# Patient Record
Sex: Male | Born: 1960 | Race: White | Hispanic: No | Marital: Single | Smoking: Former smoker
Health system: Southern US, Community
[De-identification: ages and names within clinical notes are randomized; demographics above are authoritative.]

## PROBLEM LIST (undated history)

## (undated) DIAGNOSIS — I10 Essential (primary) hypertension: Secondary | ICD-10-CM

## (undated) DIAGNOSIS — C801 Malignant (primary) neoplasm, unspecified: Secondary | ICD-10-CM

## (undated) DIAGNOSIS — M1990 Unspecified osteoarthritis, unspecified site: Secondary | ICD-10-CM

---

## 1998-07-09 ENCOUNTER — Inpatient Hospital Stay (HOSPITAL_COMMUNITY): Admission: EM | Admit: 1998-07-09 | Discharge: 1998-07-11 | Payer: Self-pay | Admitting: Emergency Medicine

## 2009-04-17 ENCOUNTER — Emergency Department (HOSPITAL_COMMUNITY): Admission: EM | Admit: 2009-04-17 | Discharge: 2009-04-17 | Payer: Self-pay | Admitting: Emergency Medicine

## 2010-12-16 IMAGING — CR DG ANKLE COMPLETE 3+V*R*
3 series · 3 of 3 positions shown · non-contrast
Comparison: None

CLINICAL DATA: Lateral and medial ankle pain secondary to a fall
today.

RIGHT ANKLE - COMPLETE 3+ VIEW

[t ankle joint ap right *]
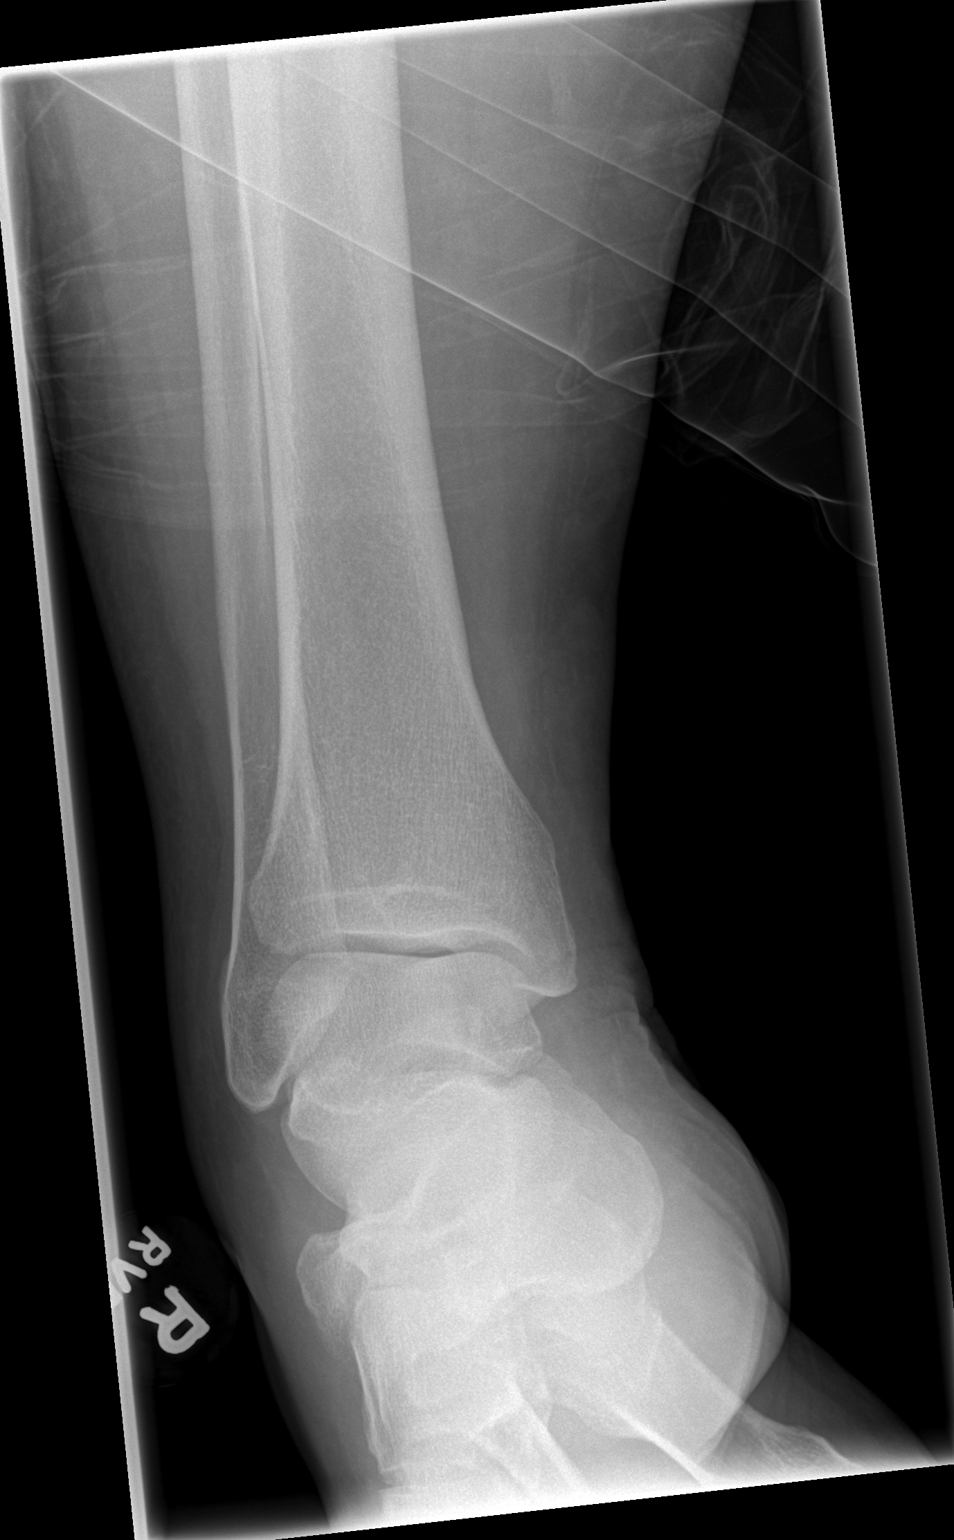

[t ankle joint oblique right]
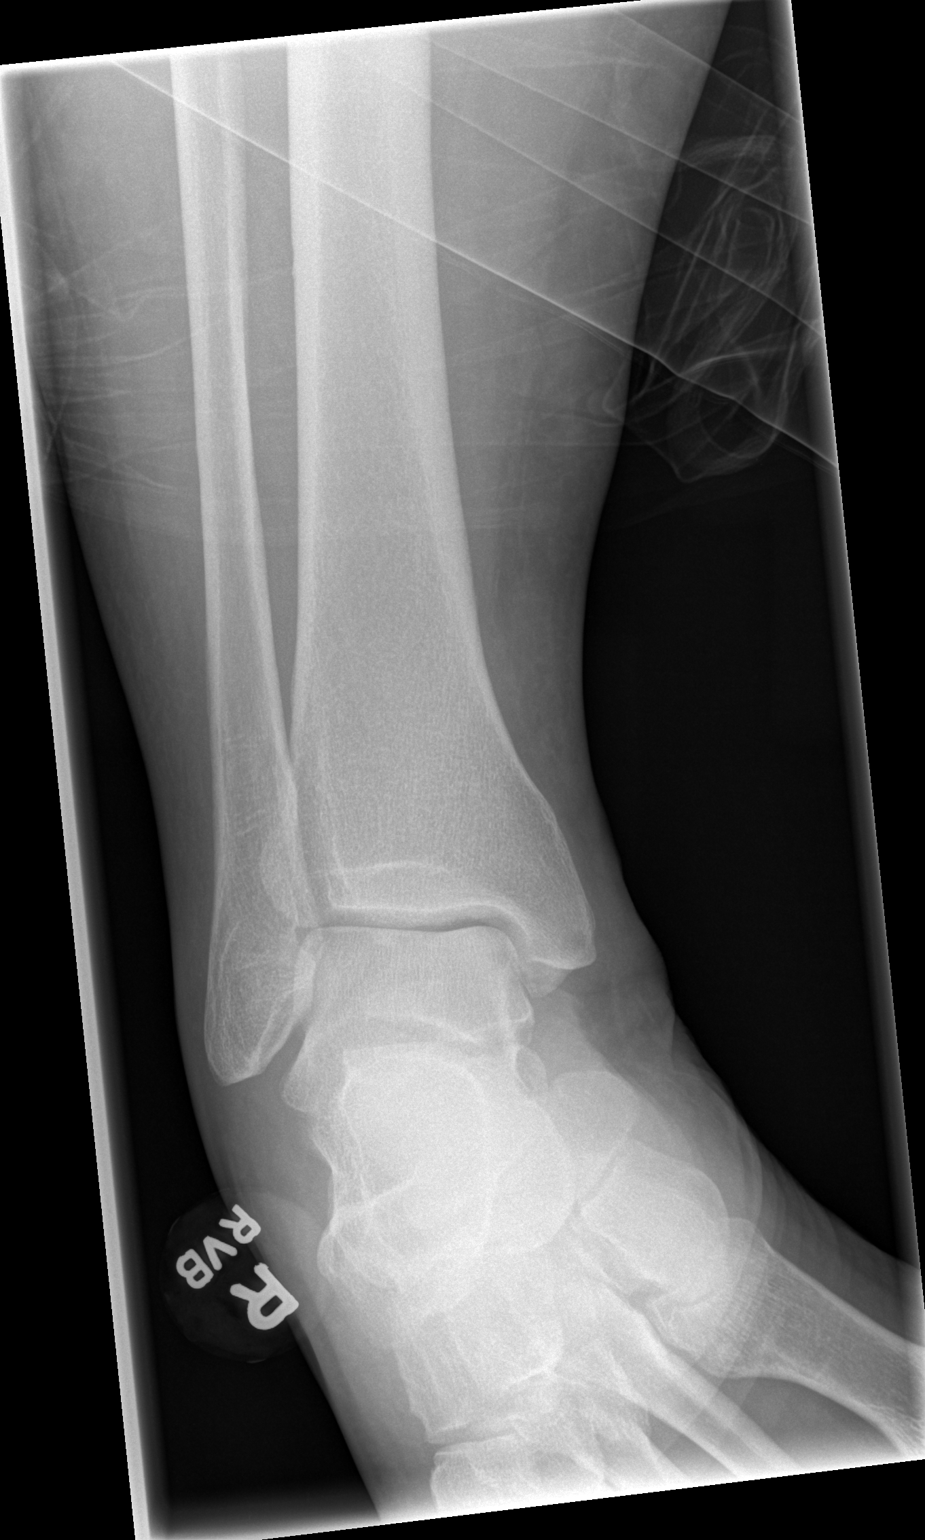

[t ankle joint lat right *]
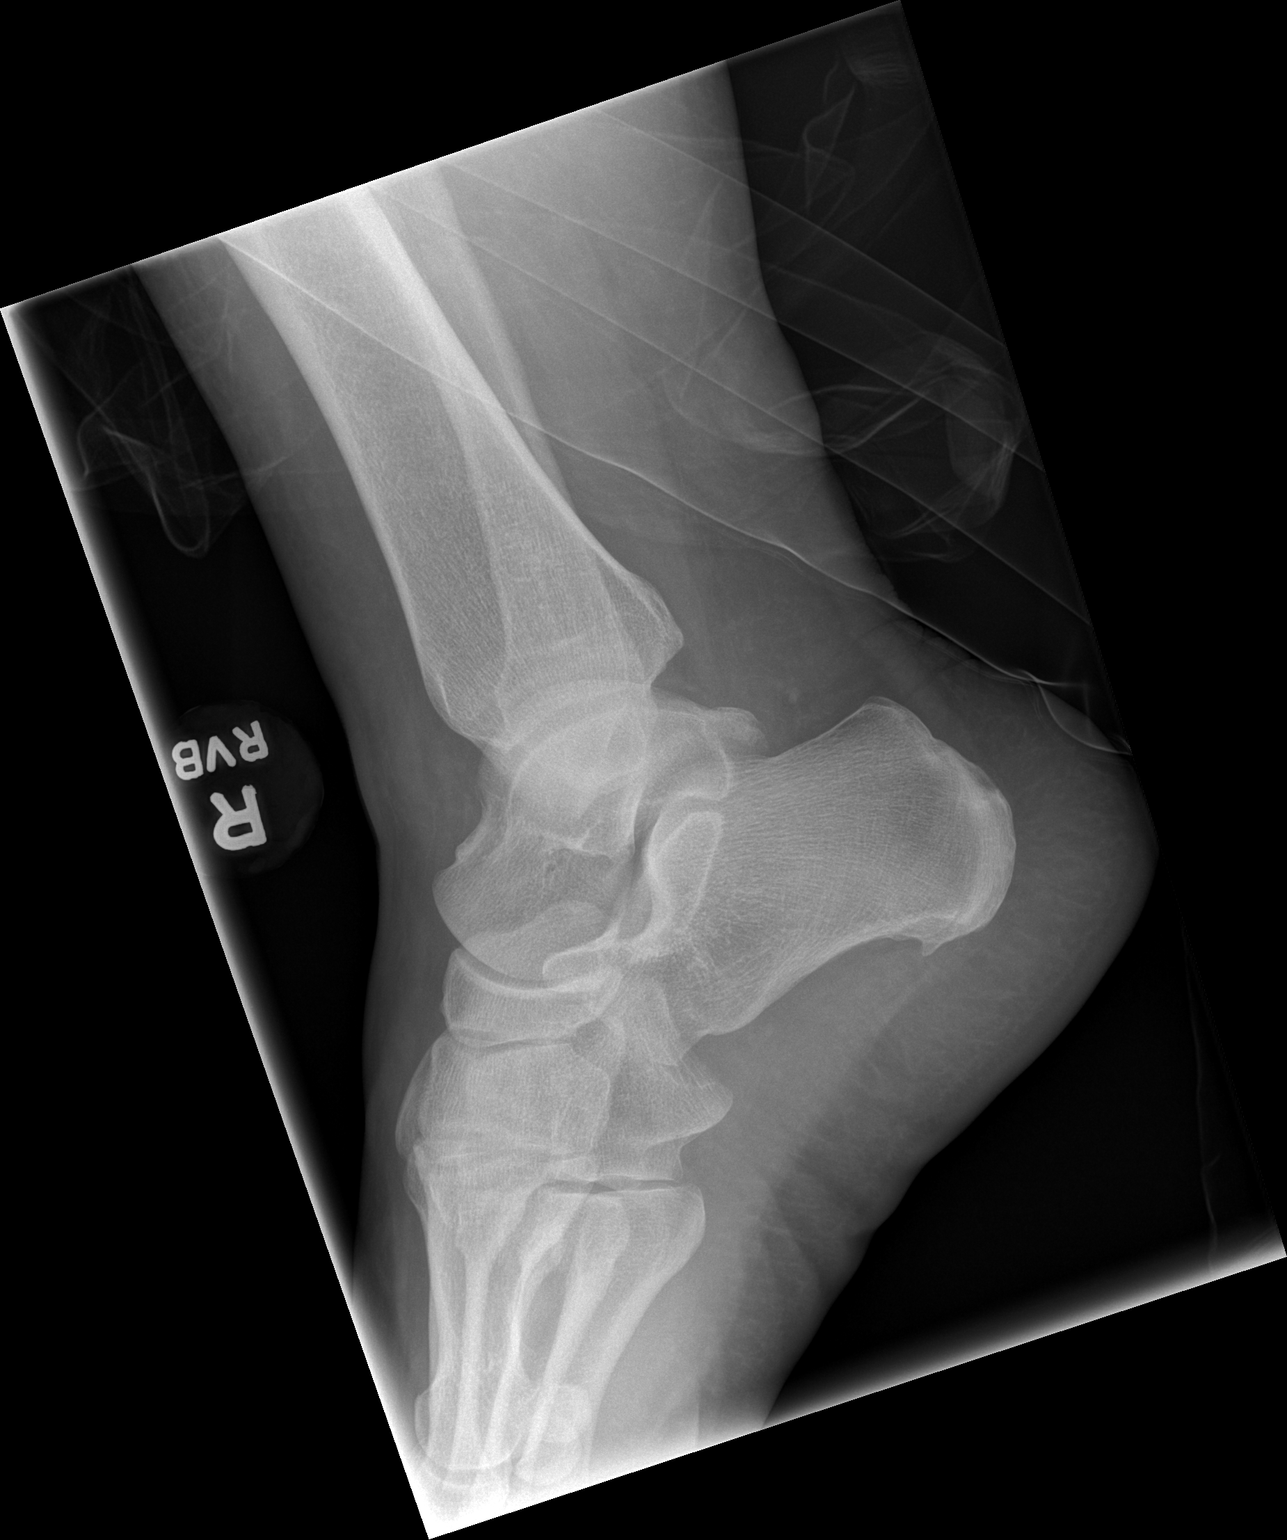

[3 of 3 positions shown; findings below may reference images not displayed]

FINDINGS: There is no fracture or dislocation.  No appreciable
joint effusion.
IMPRESSION: No significant abnormality of the right ankle.

## 2010-12-16 IMAGING — CT CT EXTREM LOW W/O CM*R*
3 of 6 series · 6 of 33 positions shown, 7 images · non-contrast
Comparison: All radiographs dated 04/17/2009

CLINICAL DATA: Posterior ankle pain after trauma

CT OF THE LEFT ANKLE WITHOUT CONTRAST
TECHNIQUE: Multidetector CT imaging of the old left ankle was
performed according to the standard protocol without intravenous
contrast. Multiplanar CT image reconstructions were also generated.

[Series 2: control scan 2.0 b60s · axial · 0.49mm/px · z∈[-1170,-1170]mm · 1 of 1 slices shown, 2 images]
[im 1/1  soft-tissue]
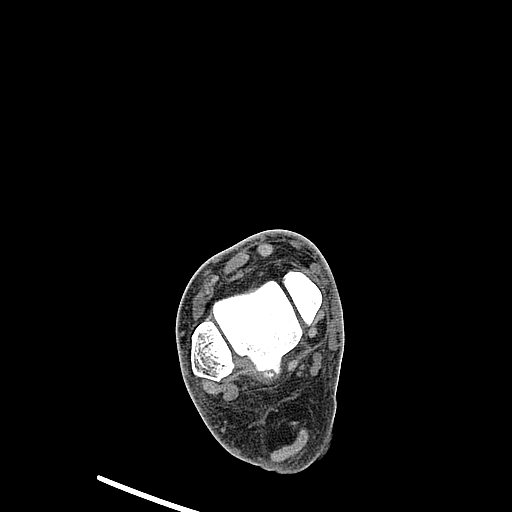
[im 1/1  bone]
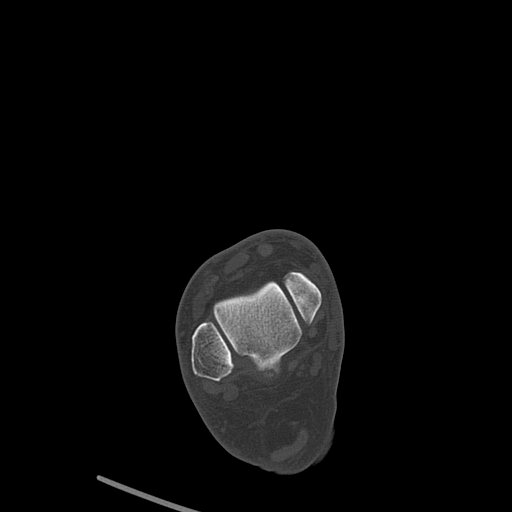

[Series 602: coronal bone · coronal · 0.38mm/px · 1 of 137 slices shown]
[im 69/137  bone]
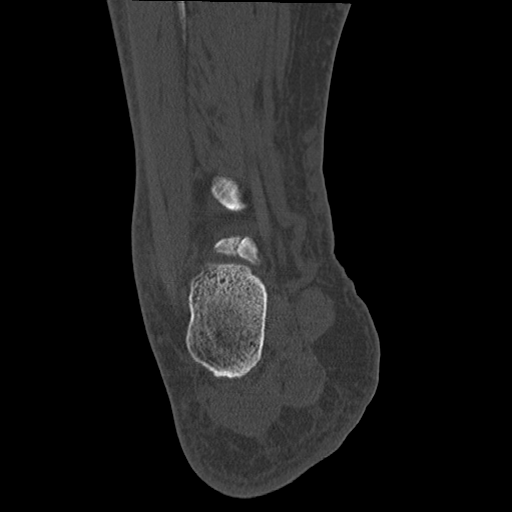

[Series 603: sagittal bone · sagittal · 0.38mm/px · 4 of 100 slices shown]
[im 20/100  bone]
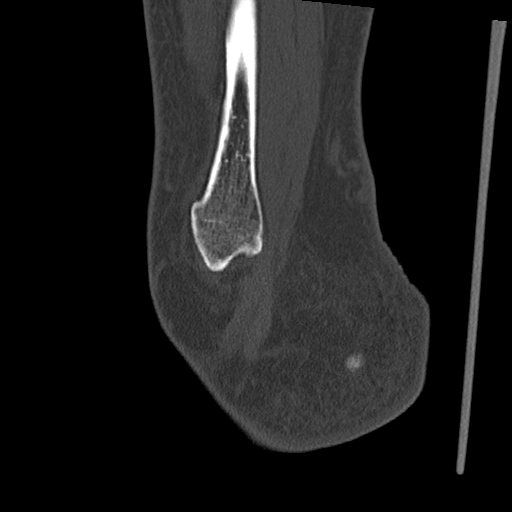
[im 40/100  bone]
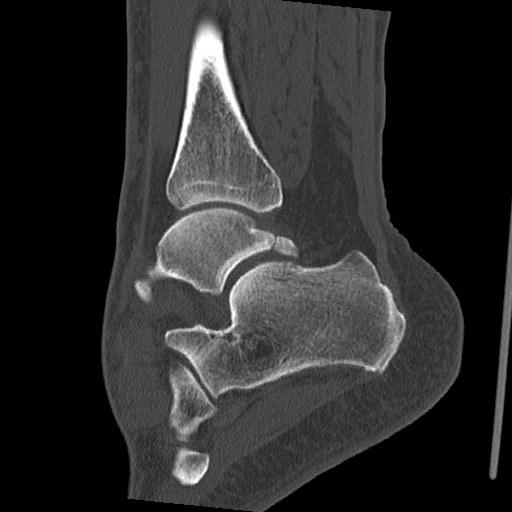
[im 60/100  bone]
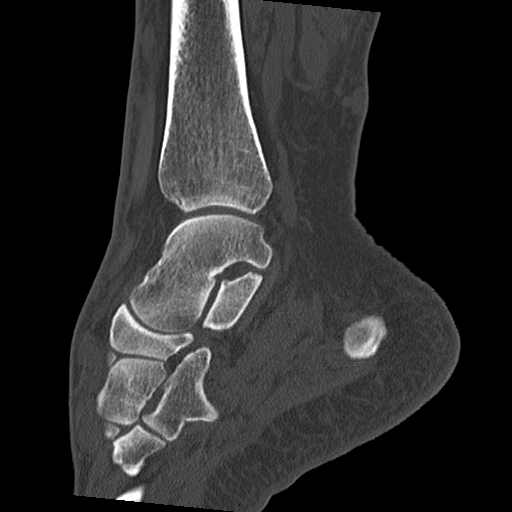
[im 80/100  bone]
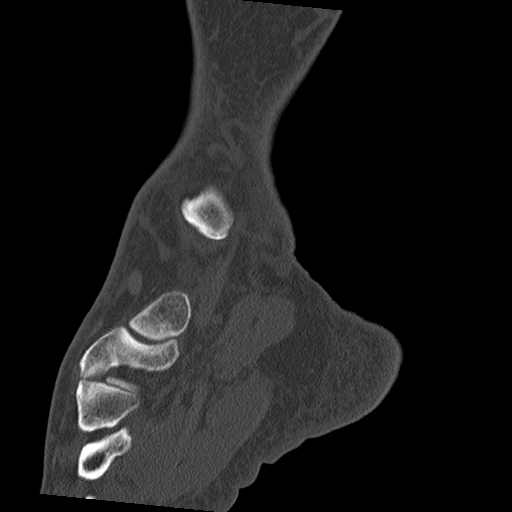

[6 of 33 positions shown; findings below may reference images not displayed]

FINDINGS: There is no acute bony abnormality of the ankle.  Note
is made of an anatomic variant of an os trigonum posterior to the
talus.  There is a small spur on the anterior inferior margin of
the distal tibia.  This also some slight spurring of the dorsal
distal talus.  There is a tiny accessory calcification or old
avulsion at the anterior process of the calcaneus.

The Achilles tendon is well visualized in three planes and is
intact.

There is no significant ankle joint effusion or subtalar joint
effusion.

The patient has prominent varicosities around the ankle.
IMPRESSION: No acute bony abnormality.  The Achilles tendon is normal.

## 2021-06-18 ENCOUNTER — Other Ambulatory Visit: Payer: Self-pay

## 2021-06-18 ENCOUNTER — Ambulatory Visit
Admission: EM | Admit: 2021-06-18 | Discharge: 2021-06-18 | Disposition: A | Discharge status: Home / Self Care | Payer: Self-pay | Attending: Emergency Medicine | Admitting: Emergency Medicine

## 2021-06-18 DIAGNOSIS — I951 Orthostatic hypotension: Secondary | ICD-10-CM

## 2021-06-18 DIAGNOSIS — R42 Dizziness and giddiness: Secondary | ICD-10-CM

## 2021-06-18 HISTORY — DX: Essential (primary) hypertension: I10

## 2021-06-18 HISTORY — DX: Malignant (primary) neoplasm, unspecified: C80.1

## 2021-06-18 HISTORY — DX: Unspecified osteoarthritis, unspecified site: M19.90

## 2021-06-18 LAB — POCT FASTING CBG KUC MANUAL ENTRY: POCT Glucose (KUC): 103 mg/dL — AB (ref 70–99)

## 2021-06-18 NOTE — ED Provider Notes (Signed)
HPI  SUBJECTIVE:  Timothy Valencia is a 60 y.o. male who presents with nausea, dizziness described as lightheadedness, tinnitus, hot and cold flashes and a sensation of "about to pass out" when he stands up.  It resolves when he lies down.  States it has happened 4 times today.  States that he has been eating and drinking well, and that he drinks over 64 ounces of water a day.  No accompanying palpitations, chest pain, shortness of breath, vertigo.  No recent vomiting, diarrhea, change in medications, increased exercise.  He states that he eats 5-6 small meals per day.  He states that he has been having similar episodes like this over the past 3 weeks but has not yet sought medical attention for this.  States that this happened once before, and he needed to have his blood pressure medication cut in half.  He is currently on 100 mg of losartan once daily.  He recently lost 65 pounds intentionally.  He tried drinking orange juice with some improvement in his symptoms.  Symptoms are better with lying down, worse with standing up.  He has a past medical history of cancer, hypertension, hypercholesterolemia, chronic pain and is on chronic opiates for the past 15 years.  No history of vertigo, diabetes, hypoglycemia, MI, arrhythmia.     Past Medical History:  Diagnosis Date   Arthritis    Cancer ([place])    Hypertension     Past Surgical History:  Procedure Laterality Date   APPENDECTOMY     GASTRIC BYPASS     HERNIA REPAIR      History reviewed. No pertinent family history.  Social History   Tobacco Use   Smoking status: Former    Types: Cigarettes    Quit date: 2017    Years since quitting: 5.5   Smokeless tobacco: Never    No current facility-administered medications for this encounter.  Current Outpatient Medications:    albuterol (VENTOLIN HFA) 108 (90 Base) MCG/ACT inhaler, INHALE 1 PUFF BY MOUTH EVERY 4 TO 6 HOURS AS NEEDED FOR SHORTNESS OF BREATH, Disp: , Rfl:    ALPRAZolam  (XANAX) 1 MG tablet, TAKE 1 TABLET(1 MG) BY MOUTH AT BEDTIME AS NEEDED FOR SLEEP, Disp: , Rfl:    baclofen (LIORESAL) 10 MG tablet, Take 1 tablet by mouth 3 (three) times daily., Disp: , Rfl:    HYDROcodone-acetaminophen (NORCO) 10-325 MG tablet, Take by mouth., Disp: , Rfl:    losartan (COZAAR) 50 MG tablet, TAKE 1 TABLET(50 MG) BY MOUTH DAILY FOR BLOOD PRESSURE, Disp: , Rfl:    meclizine (ANTIVERT) 25 MG tablet, Take by mouth., Disp: , Rfl:    omeprazole (PRILOSEC) 20 MG capsule, TAKE 1 CAPSULE(20 MG) BY MOUTH DAILY, Disp: , Rfl:    aspirin 81 MG EC tablet, Take by mouth., Disp: , Rfl:    simvastatin (ZOCOR) 20 MG tablet, Take 20 mg by mouth at bedtime., Disp: , Rfl:   No Known Allergies   ROS  As noted in HPI.   Physical Exam  BP 123/79 (BP Location: Left Arm)   Pulse 64   Temp 98.2 F (36.8 C) (Oral)   Resp 18   SpO2 96%   Constitutional: Well developed, well nourished, no acute distress Eyes:  EOMI, conjunctiva normal bilaterally HENT: Normocephalic, atraumatic,mucus membranes moist Respiratory: Normal inspiratory effort, lungs clear bilaterally, good air movement Cardiovascular: Normal rate regular rhythm, no murmurs rubs or gallops GI: nondistended skin: No rash, skin intact Musculoskeletal: no deformities Neurologic: Alert &  oriented x 3, no focal neuro deficits.  Cranial nerves III through XII intact.,  Coordination normal. Psychiatric: Speech and behavior appropriate   ED Course   Medications - No data to display  Orders Placed This Encounter  Procedures   Orthostatic vital signs    Standing Status:   Standing    Number of Occurrences:   1   POCT CBG (manual entry)    Standing Status:   Standing    Number of Occurrences:   1    Results for orders placed or performed during the hospital encounter of 06/18/21 (from the past 24 hour(s))  POCT CBG (manual entry)     Status: Abnormal   Collection Time: 06/18/21  5:28 PM  Result Value Ref Range   POCT  Glucose ([facility]) 103 (A) 70 - 99 mg/dL   No results found. Results for orders placed or performed during the hospital encounter of 57/50/51  Basic metabolic panel  Result Value Ref Range   Glucose 95 65 - 99 mg/dL   BUN 16 6 - 24 mg/dL   Creatinine, Ser 0.86 0.76 - 1.27 mg/dL   eGFR 100 >59 mL/min/1.73   BUN/Creatinine Ratio 19 9 - 20   Sodium 144 134 - 144 mmol/L   Potassium 4.5 3.5 - 5.2 mmol/L   Chloride 108 (H) 96 - 106 mmol/L   CO2 23 20 - 29 mmol/L   Calcium 9.4 8.7 - 10.2 mg/dL  POCT CBG (manual entry)  Result Value Ref Range   POCT Glucose ([facility]) 103 (A) 70 - 99 mg/dL     ED Clinical Impression  1. Positional lightheadedness   2. Orthostasis      ED Assessment/Plan  I suspect patient's blood pressure medicine dosing is too high with a 65 pound weight loss recently.  His fingerstick is normal.  In the the differential is an electrolyte imbalance such as hyponatremia, given that he drinks "lots of water" so will check BMP.  Patient is orthostatic.  His heart rate goes from 56 to 76.  Advised electrolyte containing fluids, salty foods for the next several days.  If this does not help, he is to reduce his losartan to 50 mg daily, but he will notify his PMD immediately if he does so.  BMP normal.  Discussed labs, MDM, treatment plan, and plan for follow-up with patient. Discussed sn/sx that should prompt return to the ED. patient agrees with plan.   No orders of the defined types were placed in this encounter.     *This clinic note was created using Dragon dictation software. Therefore, there may be occasional mistakes despite careful proofreading.  ?    Melynda Ripple, MD 06/20/21 [phone]

## 2021-06-18 NOTE — Discharge Instructions (Addendum)
Push electrolyte containing fluids such as Pedialyte or Gatorade.  Try adding a little more salt into your diet over the next day or 2, but do not do this for more than a few days.  I would cut your losartan from 100 mg to 50 mg if salt does not make you feel better.  Let your doctor know that you are doing this if you do so.  I will contact you only if your labs come back abnormal, and I need to send you to the hospital for further evaluation.

## 2021-06-18 NOTE — ED Triage Notes (Signed)
Onset this morning of nausea, light headedness, dizziness and fatigue. Pt reports sxs alleviated when he laid down and returned once he stood. No LOC or falls. Pt took his BP meds, thinking that may be the cause. No other meds taken. No emesis.   Pt previously prescribed antivert, but did not have any to take today.

## 2021-06-20 LAB — BASIC METABOLIC PANEL
BUN/Creatinine Ratio: 19 (ref 9–20)
BUN: 16 mg/dL (ref 6–24)
CO2: 23 mmol/L (ref 20–29)
Calcium: 9.4 mg/dL (ref 8.7–10.2)
Chloride: 108 mmol/L — ABNORMAL HIGH (ref 96–106)
Creatinine, Ser: 0.86 mg/dL (ref 0.76–1.27)
Glucose: 95 mg/dL (ref 65–99)
Potassium: 4.5 mmol/L (ref 3.5–5.2)
Sodium: 144 mmol/L (ref 134–144)
eGFR: 100 mL/min/{1.73_m2} (ref >59)

## 2021-06-25 ENCOUNTER — Emergency Department: Payer: Medicare Other

## 2021-06-25 ENCOUNTER — Encounter: Payer: Self-pay | Admitting: Emergency Medicine

## 2021-06-25 ENCOUNTER — Emergency Department
Admission: EM | Admit: 2021-06-25 | Discharge: 2021-06-25 | Disposition: A | Discharge status: Home / Self Care | Payer: Medicare Other | Attending: Emergency Medicine | Admitting: Emergency Medicine

## 2021-06-25 ENCOUNTER — Other Ambulatory Visit: Payer: Self-pay

## 2021-06-25 DIAGNOSIS — Z5321 Procedure and treatment not carried out due to patient leaving prior to being seen by health care provider: Secondary | ICD-10-CM | POA: Diagnosis not present

## 2021-06-25 DIAGNOSIS — R251 Tremor, unspecified: Secondary | ICD-10-CM | POA: Insufficient documentation

## 2021-06-25 DIAGNOSIS — R0602 Shortness of breath: Secondary | ICD-10-CM | POA: Insufficient documentation

## 2021-06-25 DIAGNOSIS — R0789 Other chest pain: Secondary | ICD-10-CM | POA: Insufficient documentation

## 2021-06-25 DIAGNOSIS — R110 Nausea: Secondary | ICD-10-CM | POA: Diagnosis not present

## 2021-06-25 DIAGNOSIS — R42 Dizziness and giddiness: Secondary | ICD-10-CM | POA: Diagnosis not present

## 2021-06-25 LAB — CBC WITH DIFFERENTIAL/PLATELET
Abs Immature Granulocytes: 0.05 10*3/uL (ref 0.00–0.07)
Basophils Absolute: 0.1 10*3/uL (ref 0.0–0.1)
Basophils Relative: 1 %
Eosinophils Absolute: 0.2 10*3/uL (ref 0.0–0.5)
Eosinophils Relative: 2 %
HCT: 36.6 % — ABNORMAL LOW (ref 39.0–52.0)
Hemoglobin: 11.9 g/dL — ABNORMAL LOW (ref 13.0–17.0)
Immature Granulocytes: 1 %
Lymphocytes Relative: 32 %
Lymphs Abs: 2.3 10*3/uL (ref 0.7–4.0)
MCH: 26.8 pg (ref 26.0–34.0)
MCHC: 32.5 g/dL (ref 30.0–36.0)
MCV: 82.4 fL (ref 80.0–100.0)
Monocytes Absolute: 0.4 10*3/uL (ref 0.1–1.0)
Monocytes Relative: 6 %
Neutro Abs: 4.1 10*3/uL (ref 1.7–7.7)
Neutrophils Relative %: 58 %
Platelets: 280 10*3/uL (ref 150–400)
RBC: 4.44 MIL/uL (ref 4.22–5.81)
RDW: 14.8 % (ref 11.5–15.5)
WBC: 7.1 10*3/uL (ref 4.0–10.5)
nRBC: 0 % (ref 0.0–0.2)

## 2021-06-25 LAB — COMPREHENSIVE METABOLIC PANEL
ALT: 19 U/L (ref 0–44)
AST: 16 U/L (ref 15–41)
Albumin: 3.9 g/dL (ref 3.5–5.0)
Alkaline Phosphatase: 55 U/L (ref 38–126)
Anion gap: 6 (ref 5–15)
BUN: 18 mg/dL (ref 6–20)
CO2: 27 mmol/L (ref 22–32)
Calcium: 9.5 mg/dL (ref 8.9–10.3)
Chloride: 105 mmol/L (ref 98–111)
Creatinine, Ser: 0.95 mg/dL (ref 0.61–1.24)
GFR, Estimated: >60 mL/min (ref >60)
Glucose, Bld: 103 mg/dL — ABNORMAL HIGH (ref 70–99)
Potassium: 4.1 mmol/L (ref 3.5–5.1)
Sodium: 138 mmol/L (ref 135–145)
Total Bilirubin: 0.7 mg/dL (ref 0.3–1.2)
Total Protein: 6.8 g/dL (ref 6.5–8.1)

## 2021-06-25 LAB — LIPASE, BLOOD: Lipase: 26 U/L (ref 11–51)

## 2021-06-25 LAB — TROPONIN I (HIGH SENSITIVITY): Troponin I (High Sensitivity): 6 ng/L (ref <18)

## 2021-06-25 NOTE — ED Provider Notes (Signed)
Emergency Medicine Provider Triage Evaluation Note  Hamzah Antwine , a 60 y.o. male  was evaluated in triage.  Pt complains of chest pressure, dizziness.  Seen at urgent care last week for dizziness; told it could be his blood pressure  Review of Systems  Positive: Chest pressure, dizziness, shortness of breath, nausea Negative: Cough, vomiting, diarrhea  Physical Exam  There were no vitals taken for this visit. Gen:   Awake, mild distress   Resp:  Normal effort  MSK:   Moves extremities without difficulty  Other:    Medical Decision Making  Medically screening exam initiated at 3:25 AM.  Appropriate orders placed.  Dameer Lamon was informed that the remainder of the evaluation will be completed by another provider, this initial triage assessment does not replace that evaluation, and the importance of remaining in the ED until their evaluation is complete.  60 year old male presenting with chest pain, dizziness, nausea and shortness of breath. Will obtain cardiac panel, EKG, chest x-ray.  Patient awaiting treatment room.   Paulette Blanch, MD 06/25/21 [phone]

## 2021-06-25 NOTE — ED Triage Notes (Signed)
  Pt comes into the ED via POV c/o chest pressure centrally, a burning sensation in his stomach, and dizziness.  Pt states he feels more SHOB than normal and has new shaking.  Pt was seen at another [facility] facility for the same thing recently.
